# Patient Record
Sex: Female | Born: 1985 | Race: White | Hispanic: No | Marital: Married | Smoking: Never smoker
Health system: Southern US, Community
[De-identification: ages and names within clinical notes are randomized; demographics above are authoritative.]

## PROBLEM LIST (undated history)

## (undated) DIAGNOSIS — N200 Calculus of kidney: Secondary | ICD-10-CM

## (undated) DIAGNOSIS — T7840XA Allergy, unspecified, initial encounter: Secondary | ICD-10-CM

## (undated) HISTORY — PX: NASAL FRACTURE SURGERY: SHX718

---

## 2014-10-13 ENCOUNTER — Emergency Department (HOSPITAL_BASED_OUTPATIENT_CLINIC_OR_DEPARTMENT_OTHER)
Admission: EM | Admit: 2014-10-13 | Discharge: 2014-10-13 | Disposition: A | Discharge status: Home / Self Care | Payer: Self-pay | Attending: Emergency Medicine | Admitting: Emergency Medicine

## 2014-10-13 ENCOUNTER — Encounter (HOSPITAL_BASED_OUTPATIENT_CLINIC_OR_DEPARTMENT_OTHER): Payer: Self-pay | Admitting: Emergency Medicine

## 2014-10-13 DIAGNOSIS — R21 Rash and other nonspecific skin eruption: Secondary | ICD-10-CM | POA: Insufficient documentation

## 2014-10-13 DIAGNOSIS — Z7952 Long term (current) use of systemic steroids: Secondary | ICD-10-CM | POA: Insufficient documentation

## 2014-10-13 DIAGNOSIS — T7840XA Allergy, unspecified, initial encounter: Secondary | ICD-10-CM | POA: Insufficient documentation

## 2014-10-13 DIAGNOSIS — Z79899 Other long term (current) drug therapy: Secondary | ICD-10-CM | POA: Insufficient documentation

## 2014-10-13 HISTORY — DX: Allergy, unspecified, initial encounter: T78.40XA

## 2014-10-13 MED ORDER — FLUCONAZOLE 150 MG PO TABS: 150.0000 mg | ORAL_TABLET | Freq: Once | ORAL | Status: DC

## 2014-10-13 MED ORDER — FAMOTIDINE 20 MG PO TABS: 20.0000 mg | ORAL_TABLET | Freq: Two times a day (BID) | ORAL | Status: AC

## 2014-10-13 MED ORDER — DIPHENHYDRAMINE HCL 50 MG/ML IJ SOLN
INTRAMUSCULAR | Status: AC
  Filled 2014-10-13: qty 1

## 2014-10-13 MED ORDER — EPINEPHRINE 0.3 MG/0.3ML IJ SOAJ: 0.3000 mg | Freq: Once | INTRAMUSCULAR | Status: AC

## 2014-10-13 MED ORDER — DIPHENHYDRAMINE HCL 50 MG/ML IJ SOLN
25.0000 mg | Freq: Once | INTRAMUSCULAR | Status: AC
  Administered 2014-10-13: 25 mg via INTRAVENOUS
  Filled 2014-10-13: qty 1

## 2014-10-13 MED ORDER — METHYLPREDNISOLONE SODIUM SUCC 125 MG IJ SOLR
125.0000 mg | Freq: Once | INTRAMUSCULAR | Status: AC
  Administered 2014-10-13: 125 mg via INTRAVENOUS
  Filled 2014-10-13: qty 2

## 2014-10-13 MED ORDER — PREDNISONE 20 MG PO TABS: ORAL_TABLET | ORAL | Status: DC

## 2014-10-13 NOTE — ED Notes (Signed)
Patient states that she was at home and had a premixed drink with ETOH in it. The patient reports that she has had this drink before and not had any problems. Patient took 50 mg of benadryl but became SOB and pale, diaphoretic with thready pulse per EMS. Patient was given 0.3 epi IM, zantac and zofran for nausea. The patient arrived alert and oriented, normal tensive and without any further complaints.

## 2014-10-13 NOTE — Discharge Instructions (Signed)

## 2014-10-13 NOTE — ED Notes (Signed)
Patient without any further s/s of rash or reaction. Patient is resting well

## 2014-10-13 NOTE — ED Provider Notes (Addendum)
CSN: [id number]     Arrival date & time 10/13/14  0121 History   First MD Initiated Contact with Patient 10/13/14 0136     Chief Complaint  Patient presents with  . Allergic Reaction     (Consider location/radiation/quality/duration/timing/severity/associated sxs/prior Treatment) Patient is a 29 y.o. female presenting with allergic reaction. The history is provided by the patient.  Allergic Reaction Presenting symptoms: difficulty breathing, itching and rash   Difficulty breathing:    Severity:  Moderate   Onset quality:  Sudden   Timing:  Constant   Progression:  Improving Itching:    Location:  Full body   Severity:  Severe   Onset quality:  Sudden   Timing:  Constant   Progression:  Resolved Severity:  Moderate Prior allergic episodes:  Unable to specify Context: food   Context comment:  Whopper and margaritas Relieved by:  Antihistamines and epinephrine Worsened by:  Nothing tried Ineffective treatments:  None tried given epi zofran and zantac en route via EMS  Past Medical History  Diagnosis Date  . Allergic reaction    History reviewed. No pertinent past surgical history. History reviewed. No pertinent family history. History  Substance Use Topics  . Smoking status: Never Smoker   . Smokeless tobacco: Not on file  . Alcohol Use: Yes     Comment: occasional   OB History    No data available     Review of Systems  Constitutional: Negative for fever.  Skin: Positive for itching and rash.  All other systems reviewed and are negative.     Allergies  Review of patient's allergies indicates no known allergies.  Home Medications   Prior to Admission medications   Medication Sig Start Date End Date Taking? Authorizing Provider  EPINEPHrine 0.3 mg/0.3 mL IJ SOAJ injection Inject 0.3 mLs (0.3 mg total) into the muscle once. 10/13/14   Sarrah Fiorenza, MD  famotidine (PEPCID) 20 MG tablet Take 1 tablet (20 mg total) by mouth 2 (two) times daily. 10/13/14    Niel Peretti, MD  predniSONE (DELTASONE) 20 MG tablet 3 tabs po day one, then 2 po daily x 4 days 10/13/14   Macoy Rodwell, MD   BP 122/67 mmHg  Pulse 90  Temp(Src) 97.8 F (36.6 C) (Oral)  Resp 20  Ht  (1.702 m)  Wt 200 lb (90.719 kg)  BMI 31.32 kg/m2  SpO2 100% Physical Exam  Constitutional: She is oriented to person, place, and time. She appears well-developed and well-nourished. No distress.  HENT:  Head: Normocephalic and atraumatic.  Mouth/Throat: Oropharynx is clear and moist. No oropharyngeal exudate.  No swelling of the lips tongue of uvula  Eyes: Conjunctivae are normal. Pupils are equal, round, and reactive to light.  Neck: Normal range of motion. Neck supple.  Cardiovascular: Normal rate, regular rhythm and intact distal pulses.   Pulmonary/Chest: Effort normal and breath sounds normal. No stridor. No respiratory distress. She has no wheezes. She has no rales. She exhibits no tenderness.  Abdominal: Soft. Bowel sounds are normal. There is no tenderness. There is no rebound and no guarding.  Musculoskeletal: Normal range of motion. She exhibits no edema.  Neurological: She is alert and oriented to person, place, and time.  Skin: Skin is warm and dry. No rash noted.  Psychiatric: She has a normal mood and affect.    ED Course  Procedures (including critical care time) Labs Review Labs Reviewed - No data to display  Imaging Review No results found.  EKG Interpretation None      MDM   Final diagnoses:  Allergic reaction, initial encounter    Medications  methylPREDNISolone sodium succinate (SOLU-MEDROL) 125 mg/2 mL injection 125 mg (125 mg Intravenous Given 10/13/14 0147)  diphenhydrAMINE (BENADRYL) injection 25 mg (25 mg Intravenous Given 10/13/14 0201)    Will watch x 4 hours and d/c with steroids H2 blocker and epi pen and refer to allergist locally.  Strict return precautions given   Monitored for 4 hours without recurrence.  No swelling of the  lips tongue or uvula at d/c.  No wheezes no stridor.   Cy BlamerApril Deng Kemler, MD 10/13/14 [id number]  Cy BlamerApril Gwendolyn Nishi, MD 10/13/14 [phone]

## 2014-11-30 ENCOUNTER — Emergency Department (HOSPITAL_BASED_OUTPATIENT_CLINIC_OR_DEPARTMENT_OTHER)
Admission: EM | Admit: 2014-11-30 | Discharge: 2014-11-30 | Disposition: A | Discharge status: Home / Self Care | Payer: Self-pay | Attending: Emergency Medicine | Admitting: Emergency Medicine

## 2014-11-30 ENCOUNTER — Encounter (HOSPITAL_BASED_OUTPATIENT_CLINIC_OR_DEPARTMENT_OTHER): Payer: Self-pay | Admitting: *Deleted

## 2014-11-30 ENCOUNTER — Emergency Department (HOSPITAL_BASED_OUTPATIENT_CLINIC_OR_DEPARTMENT_OTHER): Payer: Self-pay

## 2014-11-30 DIAGNOSIS — R109 Unspecified abdominal pain: Secondary | ICD-10-CM | POA: Insufficient documentation

## 2014-11-30 DIAGNOSIS — R1031 Right lower quadrant pain: Secondary | ICD-10-CM | POA: Insufficient documentation

## 2014-11-30 DIAGNOSIS — Z79899 Other long term (current) drug therapy: Secondary | ICD-10-CM | POA: Insufficient documentation

## 2014-11-30 DIAGNOSIS — Z3202 Encounter for pregnancy test, result negative: Secondary | ICD-10-CM | POA: Insufficient documentation

## 2014-11-30 DIAGNOSIS — Z87442 Personal history of urinary calculi: Secondary | ICD-10-CM | POA: Insufficient documentation

## 2014-11-30 DIAGNOSIS — R112 Nausea with vomiting, unspecified: Secondary | ICD-10-CM | POA: Insufficient documentation

## 2014-11-30 HISTORY — DX: Calculus of kidney: N20.0

## 2014-11-30 LAB — URINALYSIS, ROUTINE W REFLEX MICROSCOPIC
Bilirubin Urine: NEGATIVE
GLUCOSE, UA: NEGATIVE mg/dL
Hgb urine dipstick: NEGATIVE
KETONES UR: NEGATIVE mg/dL
Leukocytes, UA: NEGATIVE
Nitrite: NEGATIVE
PROTEIN: NEGATIVE mg/dL
SPECIFIC GRAVITY, URINE: 1.02 (ref 1.005–1.030)
UROBILINOGEN UA: 1 mg/dL (ref 0.0–1.0)
pH: 7 (ref 5.0–8.0)

## 2014-11-30 LAB — PREGNANCY, URINE: PREG TEST UR: NEGATIVE

## 2014-11-30 MED ORDER — KETOROLAC TROMETHAMINE 30 MG/ML IJ SOLN
30.0000 mg | Freq: Once | INTRAMUSCULAR | Status: AC
  Administered 2014-11-30: 30 mg via INTRAVENOUS
  Filled 2014-11-30: qty 1

## 2014-11-30 MED ORDER — HYDROMORPHONE HCL 1 MG/ML IJ SOLN
1.0000 mg | Freq: Once | INTRAMUSCULAR | Status: AC
  Administered 2014-11-30: 1 mg via INTRAVENOUS
  Filled 2014-11-30: qty 1

## 2014-11-30 MED ORDER — ONDANSETRON 4 MG PO TBDP
4.0000 mg | ORAL_TABLET | Freq: Once | ORAL | Status: AC
  Administered 2014-11-30: 4 mg via ORAL
  Filled 2014-11-30: qty 1

## 2014-11-30 MED ORDER — HYDROCODONE-ACETAMINOPHEN 5-325 MG PO TABS: 1.0000 | ORAL_TABLET | Freq: Four times a day (QID) | ORAL | Status: DC | PRN

## 2014-11-30 MED ORDER — ONDANSETRON HCL 4 MG/2ML IJ SOLN
4.0000 mg | Freq: Once | INTRAMUSCULAR | Status: AC
  Administered 2014-11-30: 4 mg via INTRAVENOUS
  Filled 2014-11-30: qty 2

## 2014-11-30 MED ORDER — HYDROMORPHONE HCL 1 MG/ML IJ SOLN
0.5000 mg | Freq: Once | INTRAMUSCULAR | Status: AC
  Administered 2014-11-30: 0.5 mg via INTRAVENOUS
  Filled 2014-11-30: qty 1

## 2014-11-30 MED ORDER — OXYCODONE-ACETAMINOPHEN 5-325 MG PO TABS: 1.0000 | ORAL_TABLET | ORAL | Status: AC | PRN

## 2014-11-30 MED ORDER — METHOCARBAMOL 500 MG PO TABS: 500.0000 mg | ORAL_TABLET | Freq: Two times a day (BID) | ORAL | Status: AC

## 2014-11-30 NOTE — Discharge Instructions (Signed)
Take Norco as prescribed as needed for severe pain. Take ibuprofen for mild pain regularly. Robaxin for possible muscle spasms. Try heating pads. Symptoms symptoms not improving or worsening please return to emergency department for reevaluation.  Flank Pain Flank pain refers to pain that is located on the side of the body between the upper abdomen and the back. The pain may occur over a short period of time (acute) or may be long-term or reoccurring (chronic). It may be mild or severe. Flank pain can be caused by many things. CAUSES  Some of the more common causes of flank pain include:  Muscle strains.   Muscle spasms.   A disease of your spine (vertebral disk disease).   A lung infection (pneumonia).   Fluid around your lungs (pulmonary edema).   A kidney infection.   Kidney stones.   A very painful skin rash caused by the chickenpox virus (shingles).   Gallbladder disease.  HOME CARE INSTRUCTIONS  Home care will depend on the cause of your pain. In general,  Rest as directed by your caregiver.  Drink enough fluids to keep your urine clear or pale yellow.  Only take over-the-counter or prescription medicines as directed by your caregiver. Some medicines may help relieve the pain.  Tell your caregiver about any changes in your pain.  Follow up with your caregiver as directed. SEEK IMMEDIATE MEDICAL CARE IF:   Your pain is not controlled with medicine.   You have new or worsening symptoms.  Your pain increases.   You have abdominal pain.   You have shortness of breath.   You have persistent nausea or vomiting.   You have swelling in your abdomen.   You feel faint or pass out.   You have blood in your urine.  You have a fever or persistent symptoms for more than 2-3 days.  You have a fever and your symptoms suddenly get worse. MAKE SURE YOU:   Understand these instructions.  Will watch your condition.  Will get help right away if you  are not doing well or get worse. Document Released: 07/30/2005 Document Revised: 03/02/2012 Document Reviewed: 01/21/2012 [facility] Patient Information 2015 [place], [place]. This information is not intended to replace advice given to you by your health care provider. Make sure you discuss any questions you have with your health care provider.

## 2014-11-30 NOTE — ED Provider Notes (Signed)
CSN: [id number]     Arrival date & time 11/30/14  1732 History   First MD Initiated Contact with Patient 11/30/14 1834     Chief Complaint  Patient presents with  . Flank Pain     (Consider location/radiation/quality/duration/timing/severity/associated sxs/prior Treatment) HPI Jane Deleon is a 29 y.o. female history of kidney stones, presents to emergency department complaining of right flank pain. She reports pain for several days. Has history of kidney stones, but states this pain feels slightly different. It is in the flank, radiates into the right lower abdomen. She denies any urinary symptoms. Denies any fever or chills. Admits to nausea and vomiting. She has been taking ibuprofen with no relief of her symptoms.  She denies any vaginal discharge or bleeding. No other complaints. She does not recall similar pain in the past. She is able to eat and drink today with no difficulties. No changes in her bowels. Movement and palpation of the abdomen makes pain worse. Nothing makes it better.  Past Medical History  Diagnosis Date  . Allergic reaction   . Kidney stone    Past Surgical History  Procedure Laterality Date  . Cesarean section    . Nasal fracture surgery     History reviewed. No pertinent family history. History  Substance Use Topics  . Smoking status: Never Smoker   . Smokeless tobacco: Not on file  . Alcohol Use: Yes     Comment: occasional   OB History    No data available     Review of Systems  Constitutional: Negative for fever and chills.  Respiratory: Negative for cough, chest tightness and shortness of breath.   Cardiovascular: Negative for chest pain, palpitations and leg swelling.  Gastrointestinal: Positive for nausea, vomiting and abdominal pain. Negative for diarrhea.  Genitourinary: Positive for flank pain. Negative for dysuria, frequency, hematuria, vaginal bleeding, vaginal discharge, vaginal pain and pelvic pain.  Musculoskeletal: Negative for  myalgias, arthralgias, neck pain and neck stiffness.  Skin: Negative for rash.  Neurological: Negative for dizziness, weakness and headaches.  All other systems reviewed and are negative.     Allergies  Review of patient's allergies indicates no known allergies.  Home Medications   Prior to Admission medications   Medication Sig Start Date End Date Taking? Authorizing Provider  clonazePAM (KLONOPIN) 0.5 MG tablet Take 0.5 mg by mouth 2 (two) times daily as needed for anxiety.   Yes Historical Provider, MD  EPINEPHrine 0.3 mg/0.3 mL IJ SOAJ injection Inject 0.3 mLs (0.3 mg total) into the muscle once. 10/13/14   April Palumbo, MD  famotidine (PEPCID) 20 MG tablet Take 1 tablet (20 mg total) by mouth 2 (two) times daily. 10/13/14   April Palumbo, MD   BP 123/73 mmHg  Pulse 86  Temp(Src) 98 F (36.7 C) (Oral)  Ht  (1.753 m)  Wt 200 lb (90.719 kg)  BMI 29.52 kg/m2  SpO2 100% Physical Exam  Constitutional: She is oriented to person, place, and time. She appears well-developed and well-nourished. No distress.  HENT:  Head: Normocephalic.  Eyes: Conjunctivae are normal.  Neck: Neck supple.  Cardiovascular: Normal rate, regular rhythm and normal heart sounds.   Pulmonary/Chest: Effort normal and breath sounds normal. No respiratory distress. She has no wheezes. She has no rales.  Abdominal: Soft. Bowel sounds are normal. She exhibits no distension. There is tenderness. There is no rebound.   Right lower quadrant tenderness, no guarding or rebound tenderness. No CVA tenderness bilaterally  Musculoskeletal: She exhibits  no edema.  Neurological: She is alert and oriented to person, place, and time.  Skin: Skin is warm and dry.  Psychiatric: She has a normal mood and affect. Her behavior is normal.  Nursing note and vitals reviewed.   ED Course  Procedures (including critical care time) Labs Review Labs Reviewed  PREGNANCY, URINE  URINALYSIS, ROUTINE W REFLEX MICROSCOPIC (NOT  AT [facility])    Imaging Review Ct Renal Stone Study  11/30/2014   CLINICAL DATA:  Right flank pain for 4 days. History of renal calculi. Initial encounter.  EXAM: CT ABDOMEN AND PELVIS WITHOUT CONTRAST  TECHNIQUE: Multidetector CT imaging of the abdomen and pelvis was performed following the standard protocol without IV contrast.  COMPARISON:  None.  FINDINGS: Lower chest: Clear lung bases. No significant pleural or pericardial effusion.  Hepatobiliary: Mild hepatic steatosis. As evaluated in the noncontrast state, no focal abnormalities demonstrated. No evidence of gallstones, gallbladder wall thickening or biliary dilatation.  Pancreas: Unremarkable. No pancreatic ductal dilatation or surrounding inflammatory changes.  Spleen: Normal in size without focal abnormality.  Adrenals/Urinary Tract: Both adrenal glands appear normal.There is a possible tiny calculus in the lower pole of the left kidney, best seen on the reformatted images. No right renal calculi demonstrated. There is no hydronephrosis, perinephric soft tissue stranding or ureteral calculus. The bladder appears unremarkable. Bilateral pelvic phleboliths are noted.  Stomach/Bowel: No evidence of bowel wall thickening, distention or surrounding inflammatory change.The appendix appears normal.  Vascular/Lymphatic: There are no enlarged abdominal or pelvic lymph nodes. Small mesenteric lymph nodes are not pathologically enlarged. No significant vascular findings demonstrated on noncontrast imaging.  Reproductive: Intrauterine device noted. No evidence of adnexal mass or inflammatory process.  Other: Tiny umbilical hernia containing only fat.  Musculoskeletal: No acute or significant osseous findings.  IMPRESSION: 1. No acute findings or explanation for the patient's symptoms. No evidence of hydronephrosis or ureteral calculus. 2. Possible tiny nonobstructing calculus in the lower pole of the left kidney. 3. Mild hepatic steatosis.   Electronically Signed    By: Carey Bullocks M.D.   On: 11/30/2014 19:26     EKG Interpretation None      MDM   Final diagnoses:  Flank pain, acute    patient with right flank pain for several days, no urinary symptoms, no vaginal discharge or bleeding. Denies pregnancy. History of kidney stones. Urinalysis already obtained at triage and is unremarkable. We will get CT for further evaluation.  8:08 PM  CT is unremarkable, no kidney stones, appendix is normal. On the reexamination, and no right upper quadrant pain, no guarding. Her vital signs are normal. She is afebrile. No evidence of  Pyelonephritis, urinalysis normal. Question whether this could be musculoskeletal pain. I discussed with patient her results. I explained to her  That today we did not determine the cause of her pain, but if symptoms are worsening, if she develops fever, persistent vomiting, vaginal complaints, or any new concerning symptom, she needs to return to ER. Pt voiced understanding. Home with robaxin, norco.   Pt stated she cannot take norco, percocet works better. Will change prescription to percocet.   Filed Vitals:   11/30/14 1741 11/30/14 2005  BP: 123/73 121/75  Pulse: 86 76  Temp: 98 F (36.7 C)   TempSrc: Oral   Resp:  20  Height:  (1.753 m)   Weight: 200 lb (90.719 kg)   SpO2: 100% 98%      Jaynie Crumble, PA-C 11/30/14 2242  Pricilla Loveless,  MD 12/02/14 1705

## 2014-11-30 NOTE — ED Notes (Signed)
Pt c/o right flank pain x 4 days HX stones

## 2017-03-29 IMAGING — CT CT RENAL STONE PROTOCOL
2 of 4 series · 16 of 46 positions shown, 18 images · non-contrast
Comparison: None.

CLINICAL DATA: Right flank pain for 4 days. History of renal
calculi. Initial encounter.

EXAM:
CT ABDOMEN AND PELVIS WITHOUT CONTRAST
TECHNIQUE: Multidetector CT imaging of the abdomen and pelvis was performed
following the standard protocol without IV contrast.

[Series 2: renal stone > 200 lbs 5.0 b31f · axial · 0.78mm/px · z∈[-490,-50]mm · 13 of 96 slices shown, 15 images]
[im 4/96  soft-tissue]
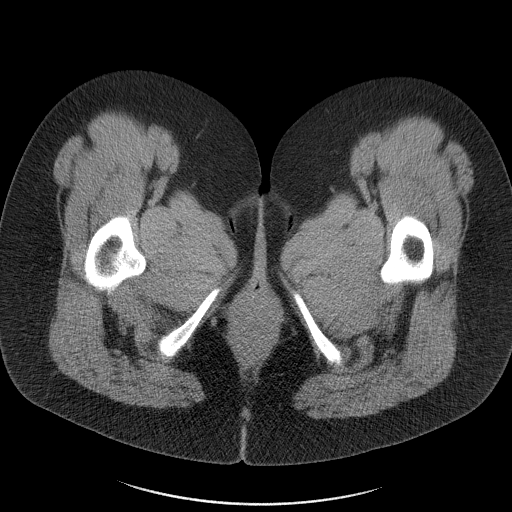
[im 4/96  bone]
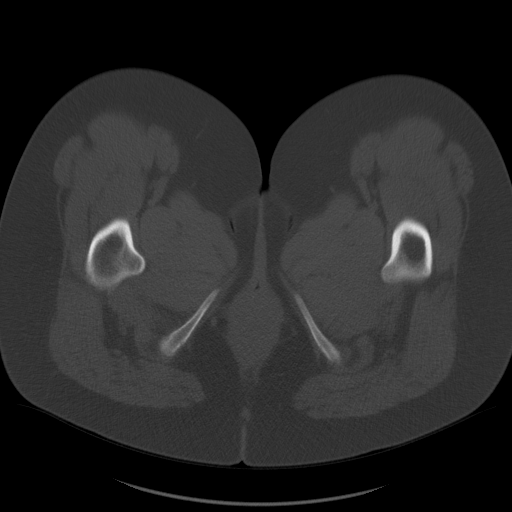
[im 12/96  soft-tissue]
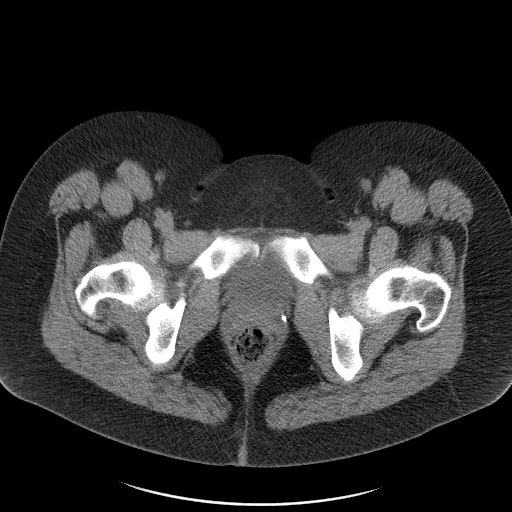
[im 20/96  soft-tissue]
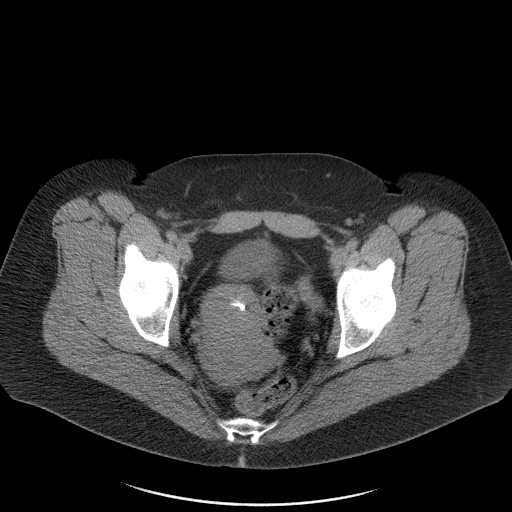
[im 27/96  soft-tissue]
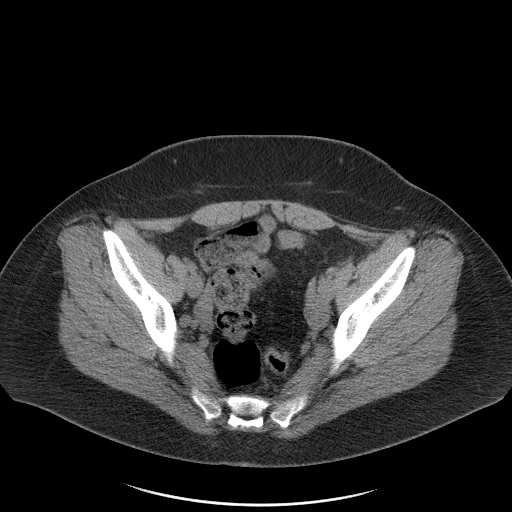
[im 35/96  soft-tissue]
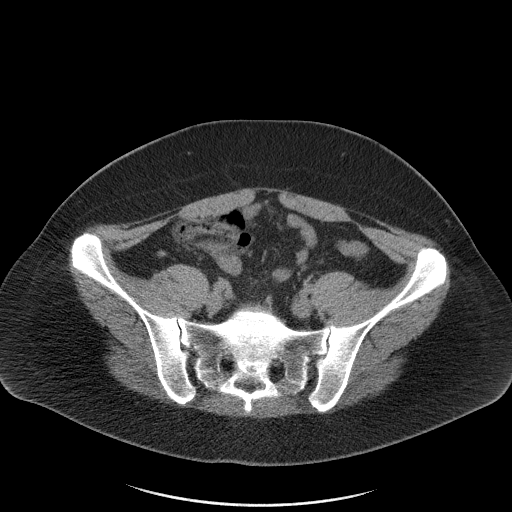
[im 42/96  soft-tissue]
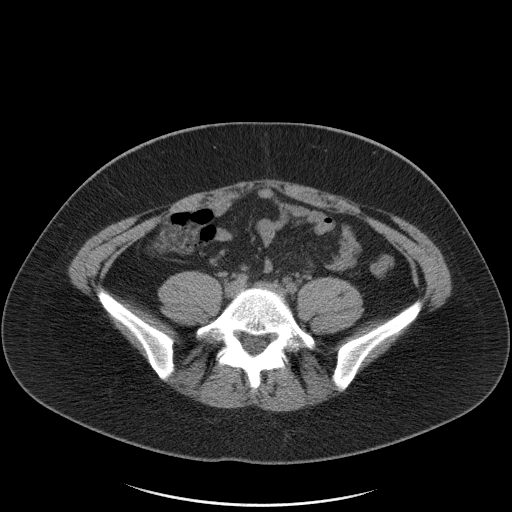
[im 50/96  soft-tissue]
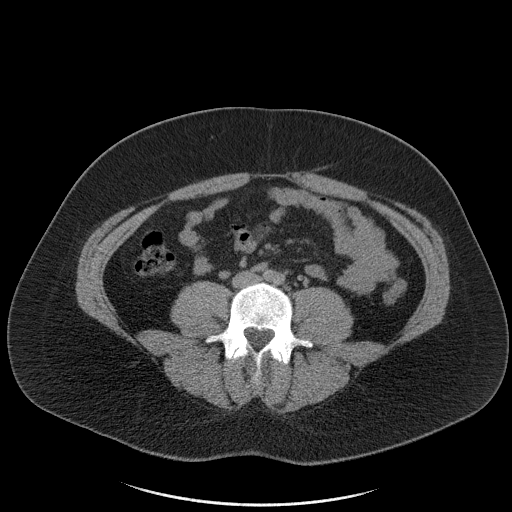
[im 54/96  soft-tissue]
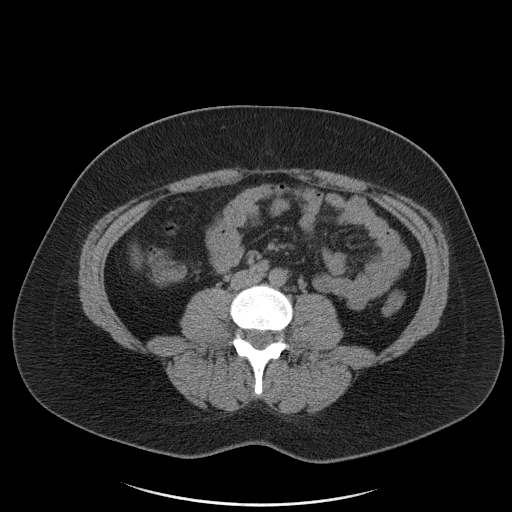
[im 61/96  soft-tissue]
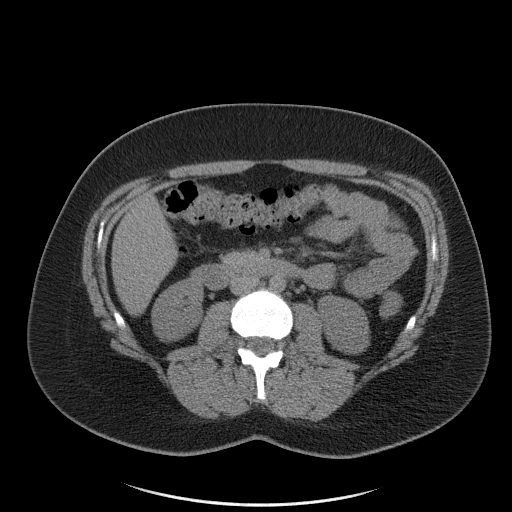
[im 61/96  bone]
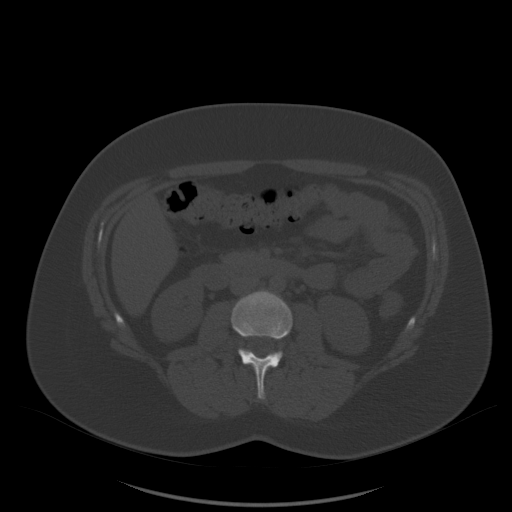
[im 69/96  soft-tissue]
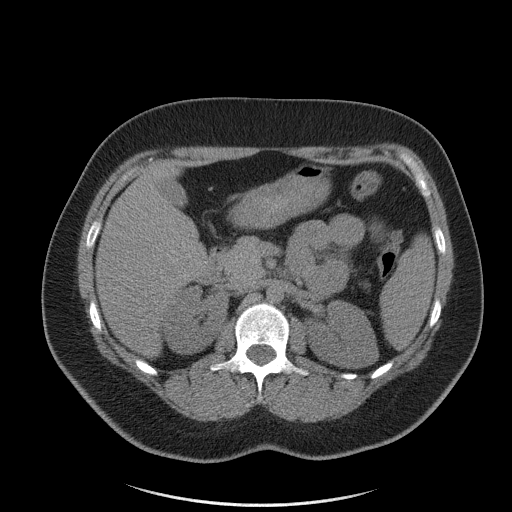
[im 77/96  soft-tissue]
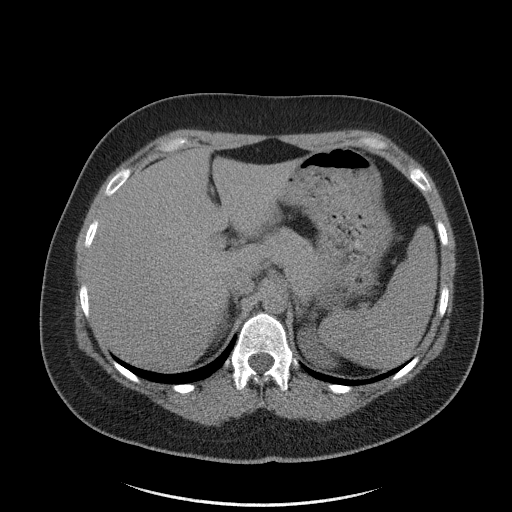
[im 84/96  soft-tissue]
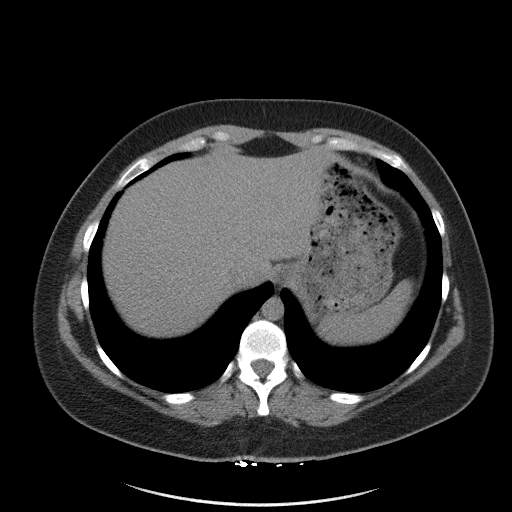
[im 92/96  soft-tissue]
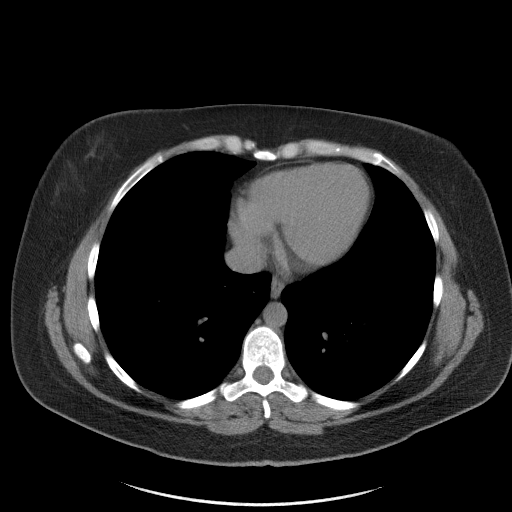

[Series 5: renal stone 3.0 coronal · coronal · 0.76mm/px · 3 of 85 slices shown]
[im 29/85  soft-tissue]
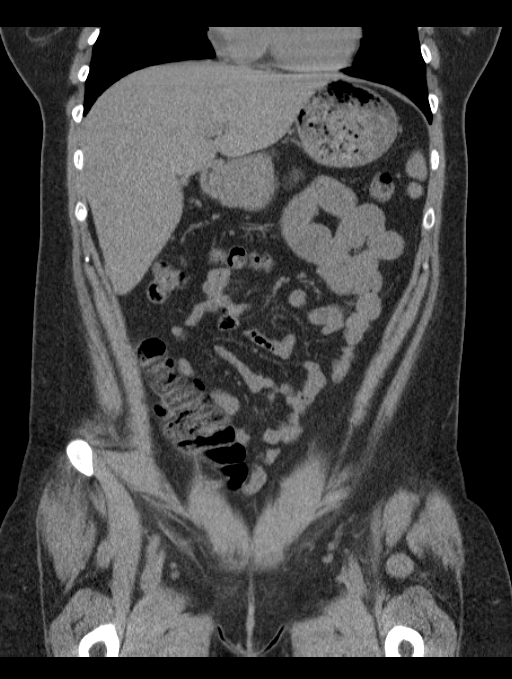
[im 38/85  soft-tissue]
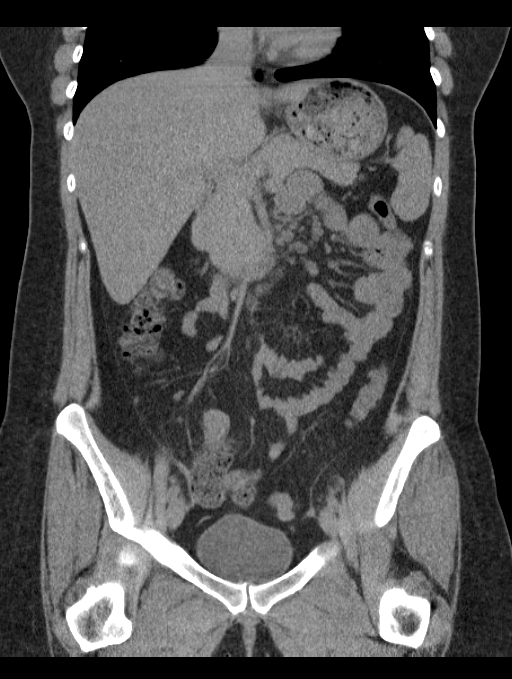
[im 47/85  soft-tissue]
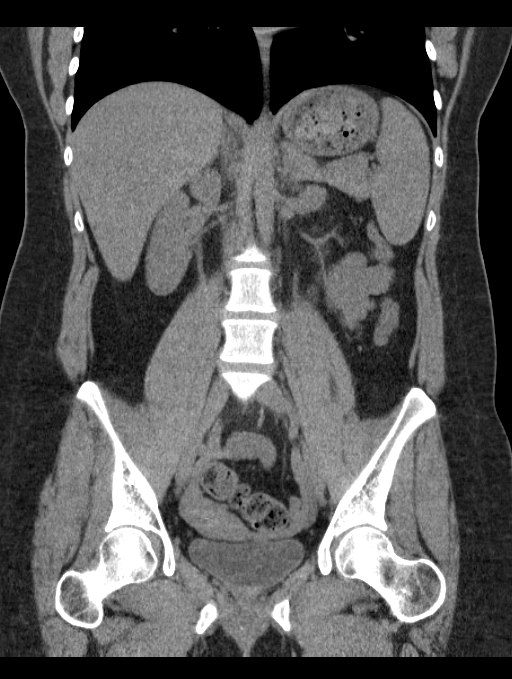

[16 of 46 positions shown; findings below may reference images not displayed]

FINDINGS: Lower chest: Clear lung bases. No significant pleural or pericardial
effusion.

Hepatobiliary: Mild hepatic steatosis. As evaluated in the
noncontrast state, no focal abnormalities demonstrated. No evidence
of gallstones, gallbladder wall thickening or biliary dilatation.

Pancreas: Unremarkable. No pancreatic ductal dilatation or
surrounding inflammatory changes.

Spleen: Normal in size without focal abnormality.

Adrenals/Urinary Tract: Both adrenal glands appear normal.There is a
possible tiny calculus in the lower pole of the left kidney, best
seen on the reformatted images. No right renal calculi demonstrated.
There is no hydronephrosis, perinephric soft tissue stranding or
ureteral calculus. The bladder appears unremarkable. Bilateral
pelvic phleboliths are noted.

Stomach/Bowel: No evidence of bowel wall thickening, distention or
surrounding inflammatory change.The appendix appears normal.

Vascular/Lymphatic: There are no enlarged abdominal or pelvic lymph
nodes. Small mesenteric lymph nodes are not pathologically enlarged.
No significant vascular findings demonstrated on noncontrast
imaging.

Reproductive: Intrauterine device noted. No evidence of adnexal mass
or inflammatory process.

Other: Tiny umbilical hernia containing only fat.

Musculoskeletal: No acute or significant osseous findings.
IMPRESSION: 1. No acute findings or explanation for the patient's symptoms. No
evidence of hydronephrosis or ureteral calculus.
2. Possible tiny nonobstructing calculus in the lower pole of the
left kidney.
3. Mild hepatic steatosis.
